# Patient Record
Sex: Female | Born: 1995 | Race: White | Hispanic: No | Marital: Single | Smoking: Never smoker
Health system: Southern US, Community
[De-identification: ages and names within clinical notes are randomized; demographics above are authoritative.]

## PROBLEM LIST (undated history)

## (undated) DIAGNOSIS — N200 Calculus of kidney: Secondary | ICD-10-CM

## (undated) DIAGNOSIS — M419 Scoliosis, unspecified: Secondary | ICD-10-CM

---
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
## 2015-02-28 ENCOUNTER — Emergency Department (HOSPITAL_BASED_OUTPATIENT_CLINIC_OR_DEPARTMENT_OTHER)
Admission: EM | Admit: 2015-02-28 | Discharge: 2015-03-01 | Disposition: A | Discharge status: Home / Self Care | Payer: Medicaid - Out of State | Attending: Emergency Medicine | Admitting: Emergency Medicine

## 2015-02-28 ENCOUNTER — Encounter (HOSPITAL_BASED_OUTPATIENT_CLINIC_OR_DEPARTMENT_OTHER): Payer: Self-pay | Admitting: *Deleted

## 2015-02-28 DIAGNOSIS — Z3202 Encounter for pregnancy test, result negative: Secondary | ICD-10-CM | POA: Insufficient documentation

## 2015-02-28 DIAGNOSIS — M419 Scoliosis, unspecified: Secondary | ICD-10-CM | POA: Insufficient documentation

## 2015-02-28 DIAGNOSIS — Z87442 Personal history of urinary calculi: Secondary | ICD-10-CM | POA: Insufficient documentation

## 2015-02-28 DIAGNOSIS — Z79899 Other long term (current) drug therapy: Secondary | ICD-10-CM | POA: Insufficient documentation

## 2015-02-28 DIAGNOSIS — Z792 Long term (current) use of antibiotics: Secondary | ICD-10-CM | POA: Insufficient documentation

## 2015-02-28 DIAGNOSIS — R102 Pelvic and perineal pain: Secondary | ICD-10-CM

## 2015-02-28 DIAGNOSIS — R110 Nausea: Secondary | ICD-10-CM | POA: Insufficient documentation

## 2015-02-28 DIAGNOSIS — R0982 Postnasal drip: Secondary | ICD-10-CM | POA: Insufficient documentation

## 2015-02-28 DIAGNOSIS — M545 Low back pain: Secondary | ICD-10-CM | POA: Insufficient documentation

## 2015-02-28 HISTORY — DX: Calculus of kidney: N20.0

## 2015-02-28 LAB — URINE MICROSCOPIC-ADD ON

## 2015-02-28 LAB — WET PREP, GENITAL
Clue Cells Wet Prep HPF POC: NONE SEEN
Trich, Wet Prep: NONE SEEN
YEAST WET PREP: NONE SEEN

## 2015-02-28 LAB — URINALYSIS, ROUTINE W REFLEX MICROSCOPIC
BILIRUBIN URINE: NEGATIVE
Glucose, UA: NEGATIVE mg/dL
Hgb urine dipstick: NEGATIVE
Ketones, ur: 15 mg/dL — AB
NITRITE: NEGATIVE
PH: 5 (ref 5.0–8.0)
PROTEIN: NEGATIVE mg/dL
Specific Gravity, Urine: 1.027 (ref 1.005–1.030)
Urobilinogen, UA: 0.2 mg/dL (ref 0.0–1.0)

## 2015-02-28 LAB — PREGNANCY, URINE: Preg Test, Ur: NEGATIVE

## 2015-02-28 MED ORDER — CEPHALEXIN 250 MG PO CAPS
500.0000 mg | ORAL_CAPSULE | Freq: Once | ORAL | Status: AC
  Administered 2015-02-28: 500 mg via ORAL
  Filled 2015-02-28: qty 2

## 2015-02-28 MED ORDER — CEPHALEXIN 500 MG PO CAPS: 500.0000 mg | ORAL_CAPSULE | Freq: Two times a day (BID) | ORAL | Status: DC

## 2015-02-28 MED ORDER — ONDANSETRON 4 MG PO TBDP
4.0000 mg | ORAL_TABLET | Freq: Once | ORAL | Status: AC
  Administered 2015-02-28: 4 mg via ORAL
  Filled 2015-02-28: qty 1

## 2015-02-28 MED ORDER — TRAMADOL HCL 50 MG PO TABS
50.0000 mg | ORAL_TABLET | Freq: Once | ORAL | Status: AC
  Administered 2015-02-28: 50 mg via ORAL
  Filled 2015-02-28: qty 1

## 2015-02-28 MED ORDER — MORPHINE SULFATE 4 MG/ML IJ SOLN
4.0000 mg | Freq: Once | INTRAMUSCULAR | Status: AC
  Administered 2015-02-28: 4 mg via INTRAMUSCULAR
  Filled 2015-02-28: qty 1

## 2015-02-28 NOTE — Discharge Instructions (Signed)
Pelvic Pain Female pelvic pain can be caused by many different things and start from a variety of places. Pelvic pain refers to pain that is located in the lower half of the abdomen and between your hips. The pain may occur over a short period of time (acute) or may be reoccurring (chronic). The cause of pelvic pain may be related to disorders affecting the female reproductive organs (gynecologic), but it may also be related to the bladder, kidney stones, an intestinal complication, or muscle or skeletal problems. Getting help right away for pelvic pain is important, especially if there has been severe, sharp, or a sudden onset of unusual pain. It is also important to get help right away because some types of pelvic pain can be life threatening.  CAUSES  Below are only some of the causes of pelvic pain. The causes of pelvic pain can be in one of several categories.   Gynecologic.  Pelvic inflammatory disease.  Sexually transmitted infection.  Ovarian cyst or a twisted ovarian ligament (ovarian torsion).  Uterine lining that grows outside the uterus (endometriosis).  Fibroids, cysts, or tumors.  Ovulation.  Pregnancy.  Pregnancy that occurs outside the uterus (ectopic pregnancy).  Miscarriage.  Labor.  Abruption of the placenta or ruptured uterus.  Infection.  Uterine infection (endometritis).  Bladder infection.  Diverticulitis.  Miscarriage related to a uterine infection (septic abortion).  Bladder.  Inflammation of the bladder (cystitis).  Kidney stone(s).  Gastrointestinal.  Constipation.  Diverticulitis.  Neurologic.  Trauma.  Feeling pelvic pain because of mental or emotional causes (psychosomatic).  Cancers of the bowel or pelvis. EVALUATION  Your caregiver will want to take a careful history of your concerns. This includes recent changes in your health, a careful gynecologic history of your periods (menses), and a sexual history. Obtaining your family  history and medical history is also important. Your caregiver may suggest a pelvic exam. A pelvic exam will help identify the location and severity of the pain. It also helps in the evaluation of which organ system may be involved. In order to identify the cause of the pelvic pain and be properly treated, your caregiver may order tests. These tests may include:   A pregnancy test.  Pelvic ultrasonography.  An X-ray exam of the abdomen.  A urinalysis or evaluation of vaginal discharge.  Blood tests. HOME CARE INSTRUCTIONS   Only take over-the-counter or prescription medicines for pain, discomfort, or fever as directed by your caregiver.   Rest as directed by your caregiver.   Eat a balanced diet.   Drink enough fluids to make your urine clear or pale yellow, or as directed.   Avoid sexual intercourse if it causes pain.   Apply warm or cold compresses to the lower abdomen depending on which one helps the pain.   Avoid stressful situations.   Keep a journal of your pelvic pain. Write down when it started, where the pain is located, and if there are things that seem to be associated with the pain, such as food or your menstrual cycle.  Follow up with your caregiver as directed.  SEEK MEDICAL CARE IF:  Your medicine does not help your pain.  You have abnormal vaginal discharge. SEEK IMMEDIATE MEDICAL CARE IF:   You have heavy bleeding from the vagina.   Your pelvic pain increases.   You feel light-headed or faint.   You have chills.   You have pain with urination or blood in your urine.   You have uncontrolled diarrhea   or vomiting.   You have a fever or persistent symptoms for more than 3 days.  You have a fever and your symptoms suddenly get worse.   You are being physically or sexually abused.  MAKE SURE YOU:  Understand these instructions.  Will watch your condition.  Will get help if you are not doing well or get worse. Document Released:  10/27/2004 Document Revised: 04/16/2014 Document Reviewed: 03/21/2012 [employer] Patient Information 2015 [employer]. This information is not intended to replace advice given to you by your health care provider. Make sure you discuss any questions you have with your health care provider.  

## 2015-02-28 NOTE — ED Notes (Signed)
Pt admits to lower back pain and lower abd pain x1 week, progressively worse, pt admits to noting hematuria this a.m. - pt admits to hx of kidney stones as well.

## 2015-02-28 NOTE — ED Provider Notes (Signed)
CSN: [id number]     Arrival date & time 02/28/15  1937 History  This chart was scribed for Rolan Bucco, MD by Evon Slack, ED Scribe. This patient was seen in room MH12/MH12 and the patient's care was started at 9:54 PM.     Chief Complaint  Patient presents with  . Abdominal Pain  . Back Pain  . Hematuria    Patient is a 19 y.o. female presenting with abdominal pain, back pain, and hematuria. The history is provided by the patient. No language interpreter was used.  Abdominal Pain Associated symptoms: hematuria and nausea   Associated symptoms: no chest pain, no chills, no cough, no diarrhea, no dysuria, no fatigue, no fever, no shortness of breath, no vaginal bleeding, no vaginal discharge and no vomiting   Back Pain Associated symptoms: abdominal pain   Associated symptoms: no chest pain, no dysuria, no fever, no headaches, no numbness and no weakness   Hematuria Associated symptoms include abdominal pain. Pertinent negatives include no chest pain, no headaches and no shortness of breath.   HPI Comments: Niemah Schwebke is a 19 y.o. female who presents to the Emergency Department complaining of lower abdominal pain. Pt states that the pain radiates around to her low back.Pt states she has associated hematuria. Pt states he has some associated nausea but states that this is not new. Pt states she presents with some URI symptoms that include rhinorrhea and post nasal drainage that are unrelated. Pt denies any medications PTA. Pt doesn't report any alleviating or worsening factors. Pt states she has a Hx of similar pain when she had previous kidney infection. Denies dysuria, difficulty urinating, vaginal bleeding, vaginal discharge or fever.  She has had similar pain to this with a urinary infection.   Past Medical History  Diagnosis Date  . Scoliosis   . Kidney stone    History reviewed. No pertinent past surgical history. History reviewed. No pertinent family history. History   Substance Use Topics  . Smoking status: Never Smoker   . Smokeless tobacco: Not on file  . Alcohol Use: No   OB History    No data available      Review of Systems  Constitutional: Negative for fever, chills, diaphoresis and fatigue.  HENT: Positive for postnasal drip and rhinorrhea. Negative for congestion and sneezing.   Eyes: Negative.   Respiratory: Negative for cough, chest tightness and shortness of breath.   Cardiovascular: Negative for chest pain and leg swelling.  Gastrointestinal: Positive for nausea and abdominal pain. Negative for vomiting, diarrhea and blood in stool.  Genitourinary: Positive for hematuria. Negative for dysuria, frequency, flank pain, vaginal bleeding, vaginal discharge and difficulty urinating.  Musculoskeletal: Positive for back pain. Negative for arthralgias.  Skin: Negative for rash.  Neurological: Negative for dizziness, speech difficulty, weakness, numbness and headaches.  All other systems reviewed and are negative.   Allergies  Review of patient's allergies indicates no known allergies.  Home Medications   Prior to Admission medications   Medication Sig Start Date End Date Taking? Authorizing Provider  cephALEXin (KEFLEX) 500 MG capsule Take 1 capsule (500 mg total) by mouth 2 (two) times daily. 02/28/15   Rolan Bucco, MD  cyclobenzaprine (FLEXERIL) 10 MG tablet Take 1 tablet (10 mg total) by mouth 2 (two) times daily as needed for muscle spasms. 12/26/14   Tatyana Kirichenko, PA-C  ibuprofen (ADVIL,MOTRIN) 600 MG tablet Take 1 tablet (600 mg total) by mouth every 6 (six) hours as needed. 08/03/14   Melvenia Beam  Upstill, PA-C  Ibuprofen (MOTRIN PO) Take by mouth.    Historical Provider, MD  naproxen (NAPROSYN) 500 MG tablet Take 1 tablet (500 mg total) by mouth 2 (two) times daily. 12/26/14   Tatyana Kirichenko, PA-C  sulfamethoxazole-trimethoprim (BACTRIM DS) 800-160 MG per tablet Take 1 tablet by mouth 2 (two) times daily. 08/03/14   Elpidio AnisShari Upstill,  PA-C  UNKNOWN TO PATIENT Med for "Ph unbalanced"-vaginal    Historical Provider, MD   BP 115/62 mmHg  Pulse 110  Temp(Src) 99.9 F (37.7 C) (Oral)  Resp 16  Ht 5\' 7"  (1.702 m)  Wt 120 lb (54.432 kg)  BMI 18.79 kg/m2  SpO2 100%   Physical Exam  Constitutional: She is oriented to person, place, and time. She appears well-developed and well-nourished.  HENT:  Head: Normocephalic and atraumatic.  Eyes: Pupils are equal, round, and reactive to light.  Neck: Normal range of motion. Neck supple.  Cardiovascular: Normal rate, regular rhythm and normal heart sounds.   Pulmonary/Chest: Effort normal and breath sounds normal. No respiratory distress. She has no wheezes. She has no rales. She exhibits no tenderness.  Abdominal: Soft. Bowel sounds are normal. There is no tenderness. There is no rebound and no guarding.  Genitourinary: Vaginal discharge (small amount of white discharge) found.  No CMT, no adnexal tenderness  Musculoskeletal: Normal range of motion. She exhibits no edema.  Lymphadenopathy:    She has no cervical adenopathy.  Neurological: She is alert and oriented to person, place, and time.  Skin: Skin is warm and dry. No rash noted.  Psychiatric: She has a normal mood and affect.    ED Course  Procedures (including critical care time) DIAGNOSTIC STUDIES: Oxygen Saturation is 100% on RA, normal by my interpretation.    COORDINATION OF CARE: 9:58 PM-Discussed treatment plan with pt at bedside and pt agreed to plan.     Labs Review Results for orders placed or performed during the hospital encounter of 02/28/15  Wet prep, genital  Result Value Ref Range   Yeast Wet Prep HPF POC NONE SEEN NONE SEEN   Trich, Wet Prep NONE SEEN NONE SEEN   Clue Cells Wet Prep HPF POC NONE SEEN NONE SEEN   WBC, Wet Prep HPF POC FEW (A) NONE SEEN  Pregnancy, urine  Result Value Ref Range   Preg Test, Ur NEGATIVE NEGATIVE  Urinalysis, Routine w reflex microscopic  Result Value Ref  Range   Color, Urine AMBER (A) YELLOW   APPearance CLEAR CLEAR   Specific Gravity, Urine 1.027 1.005 - 1.030   pH 5.0 5.0 - 8.0   Glucose, UA NEGATIVE NEGATIVE mg/dL   Hgb urine dipstick NEGATIVE NEGATIVE   Bilirubin Urine NEGATIVE NEGATIVE   Ketones, ur 15 (A) NEGATIVE mg/dL   Protein, ur NEGATIVE NEGATIVE mg/dL   Urobilinogen, UA 0.2 0.0 - 1.0 mg/dL   Nitrite NEGATIVE NEGATIVE   Leukocytes, UA SMALL (A) NEGATIVE  Urine microscopic-add on  Result Value Ref Range   Squamous Epithelial / LPF FEW (A) RARE   WBC, UA 3-6 <3 WBC/hpf   Bacteria, UA RARE RARE   Urine-Other MUCOUS PRESENT    No results found.    Imaging Review No results found.   EKG Interpretation None      MDM   Final diagnoses:  Pelvic pain in female   Patient presents with lower abdominal pain. She says it feels similar to when she had cystitis in the past. She doesn't have any significant tenderness on exam. Her  pelvic exam is unremarkable. She has some slight discharge but no tenderness on palpation during the pelvic exam. Her urine has small leukocyte esterase and small amount of white cells. She does have some reports of hematuria that she has no blood in her urine today. Her symptoms are not consistent with renal colic. Given her symptoms I will go ahead and treat her with Keflex for possible UTI. Her urine was sent for culture. She was encouraged to follow-up with the [facility] outpatient center if her pain does not improve or return here as needed for any worsening symptoms.   I personally performed the services described in this documentation, which was scribed in my presence.  The recorded information has been reviewed and considered.      Rolan Bucco, MD 02/28/15 [phone]

## 2015-02-28 NOTE — ED Notes (Signed)
C/o lower back and abd pain x 1 week

## 2015-03-02 LAB — RPR: RPR Ser Ql: NONREACTIVE

## 2015-03-02 LAB — HIV ANTIBODY (ROUTINE TESTING W REFLEX): HIV Screen 4th Generation wRfx: NONREACTIVE

## 2015-03-03 LAB — URINE CULTURE
COLONY COUNT: NO GROWTH
CULTURE: NO GROWTH
Special Requests: NORMAL

## 2015-03-04 LAB — GC/CHLAMYDIA PROBE AMP (~~LOC~~) NOT AT ARMC
CHLAMYDIA, DNA PROBE: POSITIVE — AB
NEISSERIA GONORRHEA: NEGATIVE

## 2015-03-16 ENCOUNTER — Telehealth (HOSPITAL_BASED_OUTPATIENT_CLINIC_OR_DEPARTMENT_OTHER): Payer: Self-pay | Admitting: Emergency Medicine

## 2016-04-27 ENCOUNTER — Encounter (HOSPITAL_COMMUNITY): Payer: Self-pay | Admitting: Emergency Medicine

## 2016-04-27 ENCOUNTER — Emergency Department (HOSPITAL_COMMUNITY): Payer: 59

## 2016-04-27 ENCOUNTER — Emergency Department (HOSPITAL_COMMUNITY)
Admission: EM | Admit: 2016-04-27 | Discharge: 2016-04-27 | Disposition: A | Discharge status: Home / Self Care | Payer: 59 | Attending: Emergency Medicine | Admitting: Emergency Medicine

## 2016-04-27 DIAGNOSIS — S01111A Laceration without foreign body of right eyelid and periocular area, initial encounter: Secondary | ICD-10-CM | POA: Insufficient documentation

## 2016-04-27 DIAGNOSIS — Y92410 Unspecified street and highway as the place of occurrence of the external cause: Secondary | ICD-10-CM | POA: Insufficient documentation

## 2016-04-27 DIAGNOSIS — Z79891 Long term (current) use of opiate analgesic: Secondary | ICD-10-CM | POA: Diagnosis not present

## 2016-04-27 DIAGNOSIS — Y999 Unspecified external cause status: Secondary | ICD-10-CM | POA: Insufficient documentation

## 2016-04-27 DIAGNOSIS — S0531XA Ocular laceration without prolapse or loss of intraocular tissue, right eye, initial encounter: Secondary | ICD-10-CM | POA: Diagnosis present

## 2016-04-27 DIAGNOSIS — Z23 Encounter for immunization: Secondary | ICD-10-CM | POA: Diagnosis not present

## 2016-04-27 DIAGNOSIS — Y939 Activity, unspecified: Secondary | ICD-10-CM | POA: Insufficient documentation

## 2016-04-27 DIAGNOSIS — S0990XA Unspecified injury of head, initial encounter: Secondary | ICD-10-CM | POA: Diagnosis not present

## 2016-04-27 DIAGNOSIS — S0181XA Laceration without foreign body of other part of head, initial encounter: Secondary | ICD-10-CM | POA: Diagnosis not present

## 2016-04-27 DIAGNOSIS — IMO0002 Reserved for concepts with insufficient information to code with codable children: Secondary | ICD-10-CM

## 2016-04-27 MED ORDER — HYDROCODONE-ACETAMINOPHEN 5-325 MG PO TABS
2.0000 | ORAL_TABLET | Freq: Once | ORAL | Status: AC
  Administered 2016-04-27: 2 via ORAL
  Filled 2016-04-27: qty 2

## 2016-04-27 MED ORDER — BACITRACIN ZINC 500 UNIT/GM EX OINT
TOPICAL_OINTMENT | CUTANEOUS | Status: AC
  Administered 2016-04-27: 1 via TOPICAL
  Filled 2016-04-27: qty 0.9

## 2016-04-27 MED ORDER — LIDOCAINE-EPINEPHRINE 1 %-1:100000 IJ SOLN
INTRAMUSCULAR | Status: AC
  Administered 2016-04-27: 02:00:00
  Filled 2016-04-27: qty 1

## 2016-04-27 MED ORDER — IBUPROFEN 800 MG PO TABS: 800.0000 mg | ORAL_TABLET | Freq: Three times a day (TID) | ORAL | Status: DC

## 2016-04-27 MED ORDER — TRAMADOL HCL 50 MG PO TABS: 50.0000 mg | ORAL_TABLET | Freq: Four times a day (QID) | ORAL | Status: DC | PRN

## 2016-04-27 MED ORDER — TETANUS-DIPHTH-ACELL PERTUSSIS 5-2.5-18.5 LF-MCG/0.5 IM SUSP
0.5000 mL | Freq: Once | INTRAMUSCULAR | Status: AC
  Administered 2016-04-27: 0.5 mL via INTRAMUSCULAR
  Filled 2016-04-27: qty 0.5
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
## 2016-04-27 NOTE — Discharge Instructions (Signed)
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
## 2016-04-27 NOTE — ED Notes (Signed)
Pt mother reports that pt can not take Ibuprofen due to stomach upset and urinary infections. MD to be notified

## 2016-04-27 NOTE — ED Notes (Signed)
2.5 cm laceration horizontally to medial to right eye, 1.5cm laceration vertically to medial right eye.
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
## 2016-04-27 NOTE — ED Notes (Signed)
MD at bedside to suture lacerations to face 
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
## 2018-01-22 ENCOUNTER — Encounter (HOSPITAL_COMMUNITY): Payer: Self-pay

## 2018-01-22 ENCOUNTER — Other Ambulatory Visit: Payer: Self-pay

## 2018-01-22 ENCOUNTER — Emergency Department (HOSPITAL_COMMUNITY)
Admission: EM | Admit: 2018-01-22 | Discharge: 2018-01-22 | Disposition: A | Discharge status: Home / Self Care | Payer: 59 | Attending: Emergency Medicine | Admitting: Emergency Medicine

## 2018-01-22 DIAGNOSIS — R300 Dysuria: Secondary | ICD-10-CM | POA: Insufficient documentation

## 2018-01-22 LAB — URINALYSIS, ROUTINE W REFLEX MICROSCOPIC
BILIRUBIN URINE: NEGATIVE
Glucose, UA: NEGATIVE mg/dL
KETONES UR: NEGATIVE mg/dL
Nitrite: POSITIVE — AB
PH: 6 (ref 5.0–8.0)
Protein, ur: 30 mg/dL — AB
SPECIFIC GRAVITY, URINE: 1.019 (ref 1.005–1.030)

## 2018-01-22 LAB — POC URINE PREG, ED: Preg Test, Ur: NEGATIVE

## 2018-01-22 MED ORDER — PHENAZOPYRIDINE HCL 200 MG PO TABS: 200.0000 mg | ORAL_TABLET | Freq: Three times a day (TID) | ORAL | 0 refills | Status: AC

## 2018-01-22 MED ORDER — NITROFURANTOIN MONOHYD MACRO 100 MG PO CAPS: 100.0000 mg | ORAL_CAPSULE | Freq: Two times a day (BID) | ORAL | 0 refills | Status: AC

## 2018-01-22 NOTE — ED Triage Notes (Signed)
Pt reports that she thinks she has a UTI. She reports a history of the same. She denies hematuria, but endorses some vaginal discharge. Pt also not sure if she is pregnant or not.

## 2018-01-22 NOTE — ED Provider Notes (Signed)
[facility]-EMERGENCY DEPT Provider Note   CSN: [id number] Arrival date & time: 01/22/18  1309     History   Chief Complaint Chief Complaint  Patient presents with  . Urinary Frequency    HPI Chloe Velez is a 22 y.o. female.  HPI 22 year old Caucasian female past medical history significant for recurrent UTIs presents to the ED with complaints of urinary symptoms.  Patient states for the past week she has been having dysuria, urgency and frequency.  Patient states that she does have a history of recurrent UTIs and states that this feels very similar.  She denies any associated hematuria.  She does report some associated vaginal discharge that is baseline for patient.  She was recently screened for STDs 2 weeks ago that was normal.  Patient is sexually active with one female partner and does not use protection.  Low suspicion for STD.  She denies any pelvic pain, abdominal pain, nausea, emesis, flank pain, fevers.  Patient is requesting antibiotics for UTI. Past Medical History:  Diagnosis Date  . Kidney stone   . Scoliosis     There are no active problems to display for this patient.   History reviewed. No pertinent surgical history.  OB History    No data available       Home Medications    Prior to Admission medications   Medication Sig Start Date End Date Taking? Authorizing Provider  nitrofurantoin, macrocrystal-monohydrate, (MACROBID) 100 MG capsule Take 1 capsule (100 mg total) by mouth 2 (two) times daily. 01/22/18   Rise MuLeaphart, Kenneth T, PA-C  phenazopyridine (PYRIDIUM) 200 MG tablet Take 1 tablet (200 mg total) by mouth 3 (three) times daily. 01/22/18   Rise MuLeaphart, Kenneth T, PA-C  traMADol (ULTRAM) 50 MG tablet Take 1 tablet (50 mg total) by mouth every 6 (six) hours as needed. 04/27/16   Eber HongMiller, Brian, MD    Family History History reviewed. No pertinent family history.  Social History Social History   Tobacco Use  . Smoking status: Never  Smoker  Substance Use Topics  . Alcohol use: No  . Drug use: Yes    Types: Marijuana     Allergies   Patient has no known allergies.   Review of Systems Review of Systems  Constitutional: Negative for chills and fever.  Gastrointestinal: Negative for abdominal pain, nausea and vomiting.  Genitourinary: Positive for dysuria, frequency, urgency and vaginal discharge (baselie). Negative for flank pain, genital sores, hematuria, pelvic pain, vaginal bleeding and vaginal pain.  Skin: Negative for rash.     Physical Exam Updated Vital Signs BP 114/78 (BP Location: Right Arm)   Pulse 75   Temp 98 F (36.7 C) (Oral)   Resp 16   Ht 5\' 6"  (1.676 m)   Wt 59 kg (130 lb)   SpO2 100%   BMI 20.98 kg/m   Physical Exam  Constitutional: She appears well-developed and well-nourished. No distress.  HENT:  Head: Normocephalic and atraumatic.  Eyes: Right eye exhibits no discharge. Left eye exhibits no discharge. No scleral icterus.  Neck: Normal range of motion.  Pulmonary/Chest: No respiratory distress.  Abdominal: Soft. Bowel sounds are normal. She exhibits no distension. There is no tenderness. There is no rebound, no guarding and no CVA tenderness.  Musculoskeletal: Normal range of motion.  Neurological: She is alert.  Skin: Skin is warm and dry. Capillary refill takes less than 2 seconds. No pallor.  Psychiatric: Her behavior is normal. Judgment and thought content normal.  Nursing note  and vitals reviewed.    ED Treatments / Results  Labs (all labs ordered are listed, but only abnormal results are displayed) Labs Reviewed  URINALYSIS, ROUTINE W REFLEX MICROSCOPIC - Abnormal; Notable for the following components:      Result Value   APPearance CLOUDY (*)    Hgb urine dipstick MODERATE (*)    Protein, ur 30 (*)    Nitrite POSITIVE (*)    Leukocytes, UA LARGE (*)    Bacteria, UA RARE (*)    Squamous Epithelial / LPF 6-30 (*)    All other components within normal limits    POC URINE PREG, ED    EKG  EKG Interpretation None       Radiology No results found.  Procedures Procedures (including critical care time)  Medications Ordered in ED Medications - No data to display   Initial Impression / Assessment and Plan / ED Course  I have reviewed the triage vital signs and the nursing notes.  Pertinent labs & imaging results that were available during my care of the patient were reviewed by me and considered in my medical decision making (see chart for details).     Pt has been diagnosed with a UTI.  Urine with positive nitrites, RBCs, WBCs consistent with a UTI.  Patient has no flank pain, fevers, nausea, vomiting, abdominal pain that would be concerning for pyelonephritis or infected stone.  Patient with negative pregnancy test.  Doubt PID.  Pt is afebrile, no CVA tenderness, normotensive, and denies N/V.  Patient with history of same and states this feels similar.  Does report baseline vaginal discharge.  No concern for STD as she was tested 2 weeks ago was normal.  I offered pelvic exam and STD testing although wet prep today and patient states that she would like to avoid at this time.  She just wants antibiotics for UTI.  Pt to be dc home with antibiotics and instructions to follow up with PCP if symptoms persist.  Given uncomplicated UTI will treat with Macrobid.  Pt is hemodynamically stable, in NAD, & able to ambulate in the ED. Evaluation does not show pathology that would require ongoing emergent intervention or inpatient treatment. I explained the diagnosis to the patient. Pain has been managed & has no complaints prior to dc. Pt is comfortable with above plan and is stable for discharge at this time. All questions were answered prior to disposition. Strict return precautions for f/u to the ED were discussed. Encouraged follow up with PCP.    Final Clinical Impressions(s) / ED Diagnoses   Final diagnoses:  Dysuria    ED Discharge Orders         Ordered    nitrofurantoin, macrocrystal-monohydrate, (MACROBID) 100 MG capsule  2 times daily     01/22/18 1648    phenazopyridine (PYRIDIUM) 200 MG tablet  3 times daily     01/22/18 1648       Rise Mu, PA-C 01/22/18 1714    Bethann Berkshire, MD 01/23/18 [phone]

## 2018-01-22 NOTE — Discharge Instructions (Signed)
You have been seen today for your complaint of pain with urination. °Your lab work showed urine infection. °Your discharge medications include °1) macrobid °Please take all of your antibiotics until finished!   You may develop abdominal discomfort or diarrhea from the antibiotic.  You may help offset this with probiotics which you can buy or get in yogurt. Do not eat  or take the probiotics until 2 hours after your antibiotic.  °2) Pyridium °This medication will help relieve pain and burning but does not treat the infection.  Make sure that you wear a panty liner as it may stain your underwear. °Home care instructions are as follows:  °1) please drink plenty of water, avoid tea and beverages with caffeine like coffee or soda °2) if you are sexually active, ,make sure to urinate immediately after intercourse °Follow up with: your doctor or the emergency department °Please seek immediate medical care if you develop any of the following symptoms: °SEEK MEDICAL CARE IF:  °You have back pain.  °You develop a fever.  °Your symptoms do not begin to resolve within 3 days.  °SEEK IMMEDIATE MEDICAL CARE IF:  °You have severe back pain or lower abdominal pain.  °You develop chills.  °You have nausea or vomiting.  °You have continued burning or discomfort with urination. ° °

## 2022-08-28 ENCOUNTER — Ambulatory Visit (HOSPITAL_COMMUNITY)
Admission: EM | Admit: 2022-08-28 | Discharge: 2022-08-28 | Disposition: A | Discharge status: Home / Self Care | Payer: Self-pay | Attending: Internal Medicine | Admitting: Internal Medicine

## 2022-08-28 ENCOUNTER — Ambulatory Visit (INDEPENDENT_AMBULATORY_CARE_PROVIDER_SITE_OTHER): Payer: Self-pay

## 2022-08-28 DIAGNOSIS — S62114A Nondisplaced fracture of triquetrum [cuneiform] bone, right wrist, initial encounter for closed fracture: Secondary | ICD-10-CM

## 2022-08-28 DIAGNOSIS — M79641 Pain in right hand: Secondary | ICD-10-CM

## 2022-08-28 DIAGNOSIS — M25531 Pain in right wrist: Secondary | ICD-10-CM

## 2022-08-28 DIAGNOSIS — W5519XA Other contact with horse, initial encounter: Secondary | ICD-10-CM

## 2022-08-28 DIAGNOSIS — M25532 Pain in left wrist: Secondary | ICD-10-CM

## 2022-08-28 MED ORDER — IBUPROFEN 800 MG PO TABS
800.0000 mg | ORAL_TABLET | Freq: Once | ORAL | Status: AC
  Administered 2022-08-28: 800 mg via ORAL

## 2022-08-28 MED ORDER — IBUPROFEN 800 MG PO TABS: 800.0000 mg | ORAL_TABLET | Freq: Three times a day (TID) | ORAL | 0 refills | Status: AC

## 2022-08-28 MED ORDER — IBUPROFEN 800 MG PO TABS
ORAL_TABLET | ORAL | Status: AC
  Filled 2022-08-28: qty 1

## 2022-08-28 MED ORDER — HYDROCODONE-ACETAMINOPHEN 5-325 MG PO TABS: 2.0000 | ORAL_TABLET | ORAL | 0 refills | Status: AC | PRN

## 2022-08-28 MED ORDER — HYDROCODONE-ACETAMINOPHEN 5-325 MG PO TABS
1.0000 | ORAL_TABLET | Freq: Once | ORAL | Status: AC
  Administered 2022-08-28: 1 via ORAL

## 2022-08-28 MED ORDER — HYDROCODONE-ACETAMINOPHEN 5-325 MG PO TABS
ORAL_TABLET | ORAL | Status: AC
  Filled 2022-08-28: qty 1

## 2022-08-28 NOTE — ED Provider Notes (Signed)
[facility]    CSN: [id number] Arrival date & time: 08/28/22  1835      History   Chief Complaint No chief complaint on file.   HPI Chloe Velez is a 26 y.o. female.   Patient presents urgent care for evaluation of bilateral wrist pain and right-sided hand/pinky pain after falling off of a horse this afternoon approximately 3 hours ago.  Patient was riding a horse in [place] with her friends revealed when the horse became "excited" and began to "take off".  When the horse took off, patient fell off the horse and does not recall how she fell but believes she fell onto her left wrist.  She does not remember hitting her head but states that she did not lose consciousness and recalls the entire event.  She is having a difficult time planing what happened because "it all happened so fast".  Greatest area of pain is on the right wrist and the right pinky.  She is able to move all 10 fingers voluntarily and has been icing bilateral wrists ever since injury happened.  Denies headache, blurry vision, decreased visual acuity, dizziness, nausea, vomiting, abdominal pain, elbow pain bilaterally, and confusion.  She has never injured her wrist in the past and has never fallen off of a horse in the past either.  No attempted use of any medications for symptoms prior to arrival urgent care.     Past Medical History:  Diagnosis Date   Kidney stone    Scoliosis     There are no problems to display for this patient.   No past surgical history on file.  OB History   No obstetric history on file.      Home Medications    Prior to Admission medications   Medication Sig Start Date End Date Taking? Authorizing Provider  HYDROcodone-acetaminophen (NORCO/VICODIN) 5-325 MG tablet Take 2 tablets by mouth every 4 (four) hours as needed. 08/28/22  Yes Carlisle Beers, FNP  ibuprofen (ADVIL) 800 MG tablet Take 1 tablet (800 mg total) by mouth 3 (three) times daily. 08/28/22  Yes  Prarthana Parlin, Donavan Burnet, FNP  nitrofurantoin, macrocrystal-monohydrate, (MACROBID) 100 MG capsule Take 1 capsule (100 mg total) by mouth 2 (two) times daily. 01/22/18   Rise Mu, PA-C  phenazopyridine (PYRIDIUM) 200 MG tablet Take 1 tablet (200 mg total) by mouth 3 (three) times daily. 01/22/18   Rise Mu, PA-C    Family History No family history on file.  Social History Social History   Tobacco Use   Smoking status: Never  Substance Use Topics   Alcohol use: No   Drug use: Yes    Types: Marijuana     Allergies   Patient has no known allergies.   Review of Systems Review of Systems Per HPI  Physical Exam Triage Vital Signs ED Triage Vitals [08/28/22 1844]  Enc Vitals Group     BP (!) 144/95     Pulse Rate 98     Resp 16     Temp (!) 97.4 F (36.3 C)     Temp Source Oral     SpO2 100 %     Weight      Height      Head Circumference      Peak Flow      Pain Score      Pain Loc      Pain Edu?      Excl. in GC?    No data found.  Updated Vital Signs BP (!) 144/95 (BP Location: Left Arm)   Pulse 98   Temp (!) 97.4 F (36.3 C) (Oral)   Resp 16   LMP 08/26/2022   SpO2 100%   Visual Acuity Right Eye Distance:   Left Eye Distance:   Bilateral Distance:    Right Eye Near:   Left Eye Near:    Bilateral Near:     Physical Exam Vitals and nursing note reviewed.  Constitutional:      Appearance: Normal appearance. She is not ill-appearing or toxic-appearing.     Comments: Very pleasant patient sitting on exam in position of comfort table in no acute distress.   HENT:     Head: Normocephalic and atraumatic.     Right Ear: Hearing and external ear normal.     Left Ear: Hearing and external ear normal.     Nose: Nose normal.     Mouth/Throat:     Lips: Pink.     Mouth: Mucous membranes are moist.  Eyes:     General: Lids are normal. Vision grossly intact. Gaze aligned appropriately.     Extraocular Movements: Extraocular movements  intact.     Conjunctiva/sclera: Conjunctivae normal.  Pulmonary:     Effort: Pulmonary effort is normal.  Abdominal:     Palpations: Abdomen is soft.  Musculoskeletal:     Cervical back: Neck supple.     Comments: Bilateral wrist pain present with movement.  Decreased range of motion due to tenderness at the bilateral wrist joints present.  No obvious swelling or deformity bilaterally.  Moves all 10 fingers voluntarily normally.  Some tenderness with palpation of the dorsal aspect of the right wrist at the ulnar head.  Pain is worse with supination of the bilateral wrists.  She is also tender to the right pinky finger but there are no obvious deformities or swelling.  +2 radial pulses bilaterally present.  Skin:    General: Skin is warm and dry.     Capillary Refill: Capillary refill takes less than 2 seconds.     Findings: No rash.  Neurological:     General: No focal deficit present.     Mental Status: She is alert and oriented to person, place, and time. Mental status is at baseline.     Cranial Nerves: No dysarthria or facial asymmetry.     Gait: Gait is intact.  Psychiatric:        Mood and Affect: Mood normal.        Speech: Speech normal.        Behavior: Behavior normal.        Thought Content: Thought content normal.        Judgment: Judgment normal.      UC Treatments / Results  Labs (all labs ordered are listed, but only abnormal results are displayed) Labs Reviewed - No data to display  EKG   Radiology DG Hand Complete Right  Result Date: 08/28/2022 CLINICAL DATA:  Recent fall from horse with hand pain, initial encounter EXAM: RIGHT HAND - COMPLETE 3+ VIEW COMPARISON:  None Available. FINDINGS: Changes are again noted posteriorly along the carpal bones consistent with triquetral fracture. No other fracture is noted. Mild soft tissue swelling is noted dorsally. IMPRESSION: Triquetral fracture is again seen. Electronically Signed   By: Alcide Clever M.D.   On:  08/28/2022 19:43   DG Wrist Complete Right  Result Date: 08/28/2022 CLINICAL DATA:  Recent fall from horse with wrist pain, initial encounter  EXAM: RIGHT WRIST - COMPLETE 3+ VIEW COMPARISON:  None Available. FINDINGS: Bony fragment is noted posteriorly along the carpal bones with associated soft tissue swelling. This is most consistent with a triquetral fracture. No other focal abnormality is noted. IMPRESSION: Posterior carpal bone fracture most consistent with a triquetral fracture. Associated soft tissue swelling is noted. Electronically Signed   By: Alcide Clever M.D.   On: 08/28/2022 19:41   DG Wrist Complete Left  Result Date: 08/28/2022 CLINICAL DATA:  Fall from horse with wrist pain, initial encounter EXAM: LEFT WRIST - COMPLETE 3+ VIEW COMPARISON:  None Available. FINDINGS: There is no evidence of fracture or dislocation. There is no evidence of arthropathy or other focal bone abnormality. Soft tissues are unremarkable. IMPRESSION: No acute abnormality noted. Electronically Signed   By: Alcide Clever M.D.   On: 08/28/2022 19:39    Procedures Procedures (including critical care time)  Medications Ordered in UC Medications  ibuprofen (ADVIL) tablet 800 mg (800 mg Oral Given 08/28/22 1904)    Initial Impression / Assessment and Plan / UC Course  I have reviewed the triage vital signs and the nursing notes.  Pertinent labs & imaging results that were available during my care of the patient were reviewed by me and considered in my medical decision making (see chart for details).     *** Final Clinical Impressions(s) / UC Diagnoses   Final diagnoses:  Nondisplaced fracture of triquetrum (cuneiform) bone, right wrist, initial encounter for closed fracture     Discharge Instructions      You have a fracture to your right wrist.   We placed a splint on your right wrist in the clinic.  Avoid getting the splint wet.   Rest, ice, and elevate your right wrist to reduce  inflammation.  You may take ibuprofen 8 mg every 8 hours as needed for pain to the right wrist.  For severe pain, I have given you a prescription for 3 days worth of strong pain medicine called Norco Vicodin.  Only use Norco Vicodin if your pain is not well controlled with ibuprofen.  Once your pain is well controlled with Norco Vicodin, switch back to using the ibuprofen.   Norco Vicodin can make you drowsy so do not take this medicine and drive, drink alcohol, or go to work.  I would like for you to follow-up with orthopedics in the next couple of weeks for reassessment and reevaluation.  Call the phone number on your paperwork for the orthopedic doctor to schedule an appointment for the next couple weeks.  Your work note was at the end the packet.  If you develop any new or worsening symptoms, please return to urgent care.  If your symptoms become severe, please go to the nearest emergency department for further evaluation.  I hope you feel better!     ED Prescriptions     Medication Sig Dispense Auth. Provider   HYDROcodone-acetaminophen (NORCO/VICODIN) 5-325 MG tablet Take 2 tablets by mouth every 4 (four) hours as needed. 10 tablet Reita May M, FNP   ibuprofen (ADVIL) 800 MG tablet Take 1 tablet (800 mg total) by mouth 3 (three) times daily. 21 tablet Carlisle Beers, FNP      I have reviewed the PDMP during this encounter.

## 2022-08-28 NOTE — Progress Notes (Signed)
Orthopedic Tech Progress Note Patient Details:  [name] 1995-12-25 [id number]  Splint was applied with movement in the fingers.  Ortho Devices Type of Ortho Device: Volar splint Ortho Device/Splint Location: RUE Ortho Device/Splint Interventions: Ordered, Application, Adjustment   Post Interventions Patient Tolerated: Fair Instructions Provided: Adjustment of device, Care of device  Georg Ruddle 08/28/2022, 9:41 PM

## 2022-08-28 NOTE — Discharge Instructions (Signed)
You have a fracture to your right wrist.   We placed a splint on your right wrist in the clinic.  Avoid getting the splint wet.   Rest, ice, and elevate your right wrist to reduce inflammation.  You may take ibuprofen 8 mg every 8 hours as needed for pain to the right wrist.  For severe pain, I have given you a prescription for 3 days worth of strong pain medicine called Norco Vicodin.  Only use Norco Vicodin if your pain is not well controlled with ibuprofen.  Once your pain is well controlled with Norco Vicodin, switch back to using the ibuprofen.   Norco Vicodin can make you drowsy so do not take this medicine and drive, drink alcohol, or go to work.  I would like for you to follow-up with orthopedics in the next couple of weeks for reassessment and reevaluation.  Call the phone number on your paperwork for the orthopedic doctor to schedule an appointment for the next couple weeks.  Your work note was at the end the packet.  If you develop any new or worsening symptoms, please return to urgent care.  If your symptoms become severe, please go to the nearest emergency department for further evaluation.  I hope you feel better!

## 2022-08-28 NOTE — ED Triage Notes (Signed)
Pt reports falling off a horse this afternoon. She is unsure of how far she fell off the horse. Pt is reporting left arm pain with some numbness.

## 2023-04-14 ENCOUNTER — Ambulatory Visit
Admission: RE | Admit: 2023-04-14 | Discharge: 2023-04-14 | Disposition: A | Discharge status: Home / Self Care | Payer: Medicaid Other | Source: Ambulatory Visit | Attending: Family Medicine | Admitting: Family Medicine

## 2023-04-14 ENCOUNTER — Other Ambulatory Visit: Payer: Self-pay | Admitting: Family Medicine

## 2023-04-14 DIAGNOSIS — N838 Other noninflammatory disorders of ovary, fallopian tube and broad ligament: Secondary | ICD-10-CM

## 2023-04-14 DIAGNOSIS — R102 Pelvic and perineal pain: Secondary | ICD-10-CM
